# Patient Record
Sex: Female | Born: 1995 | Race: White | Hispanic: No | Marital: Married | Smoking: Never smoker
Health system: Southern US, Community
[De-identification: ages and names within clinical notes are randomized; demographics above are authoritative.]

---

## 2018-07-05 ENCOUNTER — Other Ambulatory Visit: Payer: Self-pay

## 2018-07-05 ENCOUNTER — Emergency Department (HOSPITAL_BASED_OUTPATIENT_CLINIC_OR_DEPARTMENT_OTHER)
Admission: EM | Admit: 2018-07-05 | Discharge: 2018-07-05 | Disposition: A | Discharge status: Home / Self Care | Payer: Commercial Managed Care - PPO | Attending: Emergency Medicine | Admitting: Emergency Medicine

## 2018-07-05 ENCOUNTER — Emergency Department (HOSPITAL_BASED_OUTPATIENT_CLINIC_OR_DEPARTMENT_OTHER): Payer: Commercial Managed Care - PPO

## 2018-07-05 ENCOUNTER — Encounter (HOSPITAL_BASED_OUTPATIENT_CLINIC_OR_DEPARTMENT_OTHER): Payer: Self-pay | Admitting: Emergency Medicine

## 2018-07-05 DIAGNOSIS — N200 Calculus of kidney: Secondary | ICD-10-CM | POA: Insufficient documentation

## 2018-07-05 DIAGNOSIS — R109 Unspecified abdominal pain: Secondary | ICD-10-CM | POA: Diagnosis present

## 2018-07-05 LAB — URINALYSIS, ROUTINE W REFLEX MICROSCOPIC
Bilirubin Urine: NEGATIVE
Glucose, UA: NEGATIVE mg/dL
Ketones, ur: NEGATIVE mg/dL
Nitrite: NEGATIVE
Protein, ur: NEGATIVE mg/dL
Specific Gravity, Urine: 1.025 (ref 1.005–1.030)
pH: 6 (ref 5.0–8.0)

## 2018-07-05 LAB — BASIC METABOLIC PANEL
Anion gap: 10 (ref 5–15)
BUN: 13 mg/dL (ref 6–20)
CO2: 21 mmol/L — ABNORMAL LOW (ref 22–32)
Calcium: 9.1 mg/dL (ref 8.9–10.3)
Chloride: 104 mmol/L (ref 98–111)
Creatinine, Ser: 1.05 mg/dL — ABNORMAL HIGH (ref 0.44–1.00)
GFR calc Af Amer: >60 mL/min (ref >60)
GFR calc non Af Amer: >60 mL/min (ref >60)
Glucose, Bld: 123 mg/dL — ABNORMAL HIGH (ref 70–99)
POTASSIUM: 3.6 mmol/L (ref 3.5–5.1)
Sodium: 135 mmol/L (ref 135–145)

## 2018-07-05 LAB — CBC WITH DIFFERENTIAL/PLATELET
Abs Immature Granulocytes: 0.09 10*3/uL — ABNORMAL HIGH (ref 0.00–0.07)
Basophils Absolute: 0.1 10*3/uL (ref 0.0–0.1)
Basophils Relative: 1 %
Eosinophils Absolute: 0 10*3/uL (ref 0.0–0.5)
Eosinophils Relative: 0 %
HCT: 37.6 % (ref 36.0–46.0)
Hemoglobin: 13.1 g/dL (ref 12.0–15.0)
Immature Granulocytes: 1 %
LYMPHS ABS: 1.5 10*3/uL (ref 0.7–4.0)
LYMPHS PCT: 9 %
MCH: 30.9 pg (ref 26.0–34.0)
MCHC: 34.8 g/dL (ref 30.0–36.0)
MCV: 88.7 fL (ref 80.0–100.0)
Monocytes Absolute: 0.4 10*3/uL (ref 0.1–1.0)
Monocytes Relative: 2 %
Neutro Abs: 15 10*3/uL — ABNORMAL HIGH (ref 1.7–7.7)
Neutrophils Relative %: 87 %
Platelets: 242 10*3/uL (ref 150–400)
RBC: 4.24 MIL/uL (ref 3.87–5.11)
RDW: 13 % (ref 11.5–15.5)
WBC: 17 10*3/uL — ABNORMAL HIGH (ref 4.0–10.5)
nRBC: 0 % (ref 0.0–0.2)

## 2018-07-05 LAB — URINALYSIS, MICROSCOPIC (REFLEX)

## 2018-07-05 LAB — PREGNANCY, URINE: PREG TEST UR: NEGATIVE

## 2018-07-05 MED ORDER — HYDROCODONE-ACETAMINOPHEN 5-325 MG PO TABS: 1.0000 | ORAL_TABLET | Freq: Four times a day (QID) | ORAL | 0 refills | Status: AC | PRN

## 2018-07-05 MED ORDER — KETOROLAC TROMETHAMINE 15 MG/ML IJ SOLN
15.0000 mg | Freq: Once | INTRAMUSCULAR | Status: AC
  Administered 2018-07-05: 15 mg via INTRAVENOUS
  Filled 2018-07-05: qty 1

## 2018-07-05 MED ORDER — ONDANSETRON HCL 4 MG PO TABS: 4.0000 mg | ORAL_TABLET | Freq: Four times a day (QID) | ORAL | 0 refills | Status: AC

## 2018-07-05 MED ORDER — MORPHINE SULFATE (PF) 4 MG/ML IV SOLN
INTRAVENOUS | Status: AC
  Filled 2018-07-05: qty 1

## 2018-07-05 MED ORDER — MORPHINE SULFATE (PF) 4 MG/ML IV SOLN
4.0000 mg | Freq: Once | INTRAVENOUS | Status: AC
  Administered 2018-07-05: 4 mg via INTRAVENOUS

## 2018-07-05 MED ORDER — ONDANSETRON HCL 4 MG/2ML IJ SOLN
4.0000 mg | Freq: Once | INTRAMUSCULAR | Status: AC
  Administered 2018-07-05: 4 mg via INTRAVENOUS
  Filled 2018-07-05: qty 2

## 2018-07-05 MED ORDER — MORPHINE SULFATE (PF) 4 MG/ML IV SOLN
4.0000 mg | Freq: Once | INTRAVENOUS | Status: AC
  Administered 2018-07-05: 4 mg via INTRAVENOUS
  Filled 2018-07-05: qty 1

## 2018-07-05 NOTE — ED Notes (Signed)
ED Provider at bedside. 

## 2018-07-05 NOTE — ED Triage Notes (Signed)
Reports right flank pain which began this morning.  C/o nausea and diarrhea.  Denies dysuria, hematuria.

## 2018-07-05 NOTE — ED Provider Notes (Signed)
[facility] EMERGENCY DEPARTMENT Provider Note   CSN: [id number] Arrival date & time: 07/05/18  1250     History   Chief Complaint Chief Complaint  Patient presents with  . Flank Pain    HPI Claudia Esparza is a 23 y.o. female.  The history is provided by the patient.  Flank Pain  This is a new problem. The current episode started 6 to 12 hours ago. The problem occurs constantly. The problem has not changed since onset.Associated symptoms include abdominal pain. Associated symptoms comments: Nausea, vomiting, some loose stool.  No urinary sx but started after urinating today.  No vaginal bleeding.  No pain like this before.  No fever.. Nothing aggravates the symptoms. Nothing relieves the symptoms. She has tried nothing for the symptoms. The treatment provided no relief.    History reviewed. No pertinent past medical history.  There are no active problems to display for this patient.   History reviewed. No pertinent surgical history.   OB History   No obstetric history on file.      Home Medications    Prior to Admission medications   Not on File    Family History History reviewed. No pertinent family history.  Social History Social History   Tobacco Use  . Smoking status: Never Smoker  . Smokeless tobacco: Never Used  Substance Use Topics  . Alcohol use: Not on file  . Drug use: Not on file     Allergies   Sulfa antibiotics   Review of Systems Review of Systems  Gastrointestinal: Positive for abdominal pain.  Genitourinary: Positive for flank pain.  All other systems reviewed and are negative.    Physical Exam Updated Vital Signs BP 117/77 (BP Location: Left Arm)   Pulse 70   Temp 97.8 F (36.6 C) (Oral)   Resp 18   Ht 5\' 5"  (1.651 m)   Wt 72.6 kg   LMP 04/30/2018 (Approximate)   SpO2 100%   BMI 26.63 kg/m   Physical Exam Vitals signs and nursing note reviewed.  Constitutional:      General: She is in acute distress.       Appearance: She is well-developed.     Comments: Appears uncomfrotable  HENT:     Head: Normocephalic and atraumatic.     Mouth/Throat:     Mouth: Mucous membranes are moist.     Comments: pale Eyes:     Pupils: Pupils are equal, round, and reactive to light.  Cardiovascular:     Rate and Rhythm: Normal rate and regular rhythm.     Heart sounds: Normal heart sounds. No murmur. No friction rub.  Pulmonary:     Effort: Pulmonary effort is normal.     Breath sounds: Normal breath sounds. No wheezing or rales.  Abdominal:     General: Bowel sounds are normal. There is no distension.     Palpations: Abdomen is soft.     Tenderness: There is abdominal tenderness in the right lower quadrant. There is right CVA tenderness. There is no guarding or rebound.  Musculoskeletal: Normal range of motion.        General: No tenderness.     Comments: No edema  Skin:    General: Skin is warm and dry.     Coloration: Skin is pale.     Findings: No rash.  Neurological:     Mental Status: She is alert and oriented to person, place, and time.     Cranial Nerves: No cranial  nerve deficit.  Psychiatric:        Behavior: Behavior normal.      ED Treatments / Results  Labs (all labs ordered are listed, but only abnormal results are displayed) Labs Reviewed  URINALYSIS, ROUTINE W REFLEX MICROSCOPIC - Abnormal; Notable for the following components:      Result Value   APPearance CLOUDY (*)    Hgb urine dipstick SMALL (*)    Leukocytes,Ua TRACE (*)    All other components within normal limits  URINALYSIS, MICROSCOPIC (REFLEX) - Abnormal; Notable for the following components:   Bacteria, UA FEW (*)    All other components within normal limits  CBC WITH DIFFERENTIAL/PLATELET - Abnormal; Notable for the following components:   WBC 17.0 (*)    Neutro Abs 15.0 (*)    Abs Immature Granulocytes 0.09 (*)    All other components within normal limits  BASIC METABOLIC PANEL - Abnormal; Notable for  the following components:   CO2 21 (*)    Glucose, Bld 123 (*)    Creatinine, Ser 1.05 (*)    All other components within normal limits  PREGNANCY, URINE    EKG None  Radiology Ct Renal Stone Study  Result Date: 07/05/2018 CLINICAL DATA:  Right flank pain and hematuria. EXAM: CT ABDOMEN AND PELVIS WITHOUT CONTRAST TECHNIQUE: Multidetector CT imaging of the abdomen and pelvis was performed following the standard protocol without IV contrast. COMPARISON:  None. FINDINGS: Lower chest: No acute abnormality. Hepatobiliary: No focal liver abnormality is seen. No gallstones, gallbladder wall thickening, or biliary dilatation. Pancreas: Unremarkable. No pancreatic ductal dilatation or surrounding inflammatory changes. Spleen: Normal in size without focal abnormality. Adrenals/Urinary Tract: Adrenal glands are normal. Bilateral small renal stones are noted. A representative stone in the lower pole on the right measures 2.6 mm. There is mild hydronephrosis on the right. The right ureter is also mildly dilated compared to the left. There are multiple calcifications in the pelvis. A 3 mm calcification on axial image 72 is favored to represent a distal ureteral stone. Other calcifications in both side of the pelvis are consistent with phleboliths. Stomach/Bowel: Stomach and small bowel are normal. Colon is normal. The appendix is not clearly seen but there is no secondary evidence of appendicitis. Vascular/Lymphatic: No significant vascular findings are present. No enlarged abdominal or pelvic lymph nodes. Reproductive: Uterus and bilateral adnexa are unremarkable. Other: No abdominal wall hernia or abnormality. No abdominopelvic ascites. Musculoskeletal: No acute or significant osseous findings. IMPRESSION: 1. Mild right hydronephrosis and ureterectasis. A calcification adjacent to the base of the bladder measuring 3 mm is favored to represent a distal right ureteral stone, causing the patient's symptoms. There  are multiple stones in both kidneys. 2. The appendix is not visualized but there is no secondary evidence of appendicitis. Electronically Signed   By: Gerome Sam III M.D   On: 07/05/2018 14:57    Procedures Procedures (including critical care time)  Medications Ordered in ED Medications  morphine 4 MG/ML injection 4 mg (has no administration in time range)  ondansetron (ZOFRAN) injection 4 mg (has no administration in time range)     Initial Impression / Assessment and Plan / ED Course  I have reviewed the triage vital signs and the nursing notes.  Pertinent labs & imaging results that were available during my care of the patient were reviewed by me and considered in my medical decision making (see chart for details).     Pt with symptoms consistent with kidney stone.  Denies infectious sx, or GI symptoms.  Low concern for diverticulitis and no risk factors or history suggestive of AAA.  No hx suggestive of GU source (discharge) and otherwise pt is healthy.  Will treat pain and ensure no infection with UA, CBC, BMP and will get stone study to further eval.  3:22 PM Cytosis of 17,000 and creatinine 1.05.  UA with hemoglobin but no evidence of infection.  CT consistent with a 3 mm distal stone but also noted multiple stones in bilateral kidneys.  On reevaluation after 8 mg of morphine patient states the pain is more tolerable but still at a 5 out of 10.  She was given a dose of Toradol and will reassess.  4:07 PM Pt feeling better and will d/c home with pain meds  Final Clinical Impressions(s) / ED Diagnoses   Final diagnoses:  Kidney stone    ED Discharge Orders         Ordered    HYDROcodone-acetaminophen (NORCO/VICODIN) 5-325 MG tablet  Every 6 hours PRN     07/05/18 1606    ondansetron (ZOFRAN) 4 MG tablet  Every 6 hours     07/05/18 1606           Gwyneth Sprout, MD 07/05/18 1607

## 2018-07-05 NOTE — Discharge Instructions (Signed)
Make sure you are drinking plenty of fluids.  Take a pain pill when you pick it up and then another one before you go to bed to avoid breakthrough pain.  You can also take ibuprofen.  Please return if you develop excruciating pain, persistent vomiting or a fever

## 2020-09-16 IMAGING — CT CT RENAL STONE PROTOCOL
2 of 4 series · 16 of 46 positions shown, 18 images · non-contrast
Comparison: None.

CLINICAL DATA: Right flank pain and hematuria.

EXAM:
CT ABDOMEN AND PELVIS WITHOUT CONTRAST
TECHNIQUE: Multidetector CT imaging of the abdomen and pelvis was performed
following the standard protocol without IV contrast.

[Series 2: axial st · axial · 0.79mm/px · z∈[+225,+645]mm · 13 of 92 slices shown, 15 images]
[im 4/92  soft-tissue]
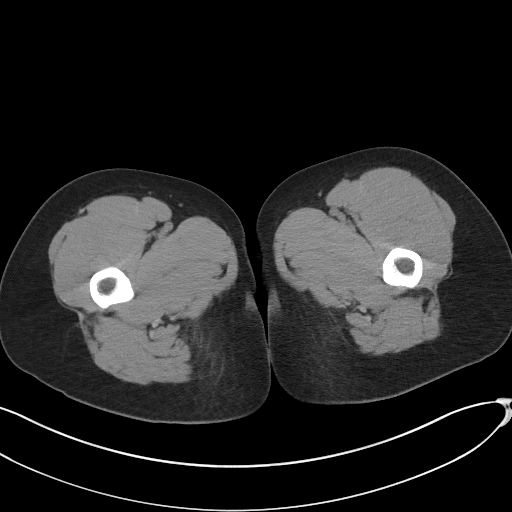
[im 4/92  bone]
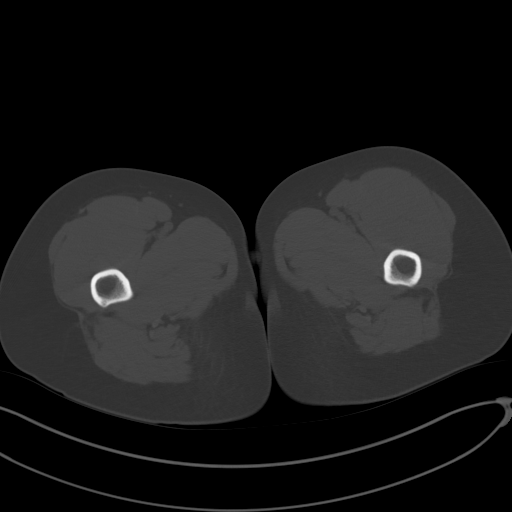
[im 11/92  soft-tissue]
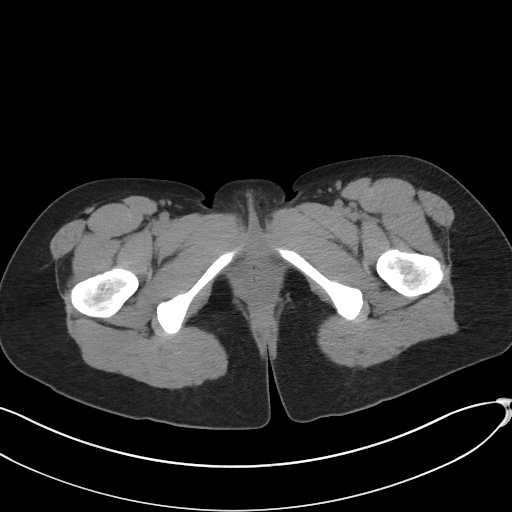
[im 18/92  soft-tissue]
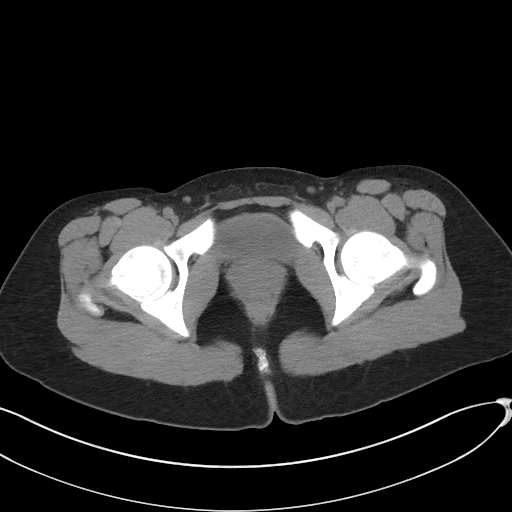
[im 25/92  soft-tissue]
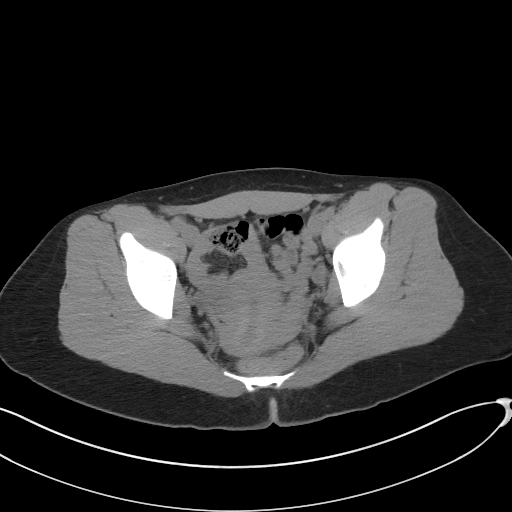
[im 32/92  soft-tissue]
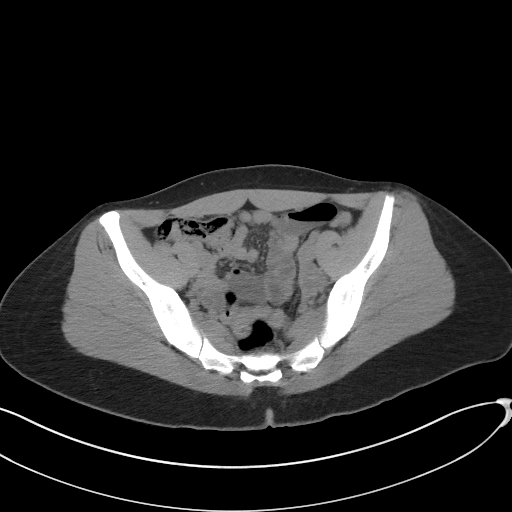
[im 39/92  soft-tissue]
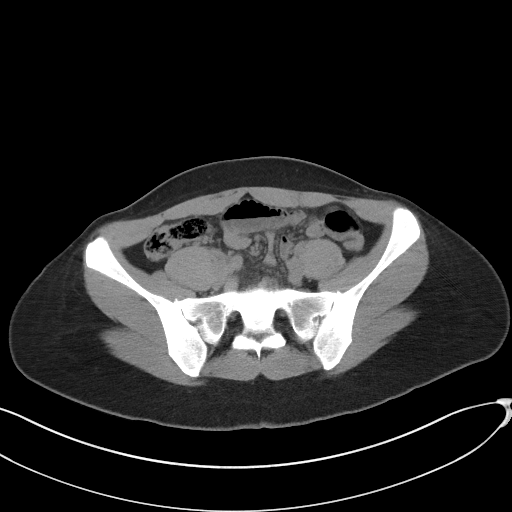
[im 46/92  soft-tissue]
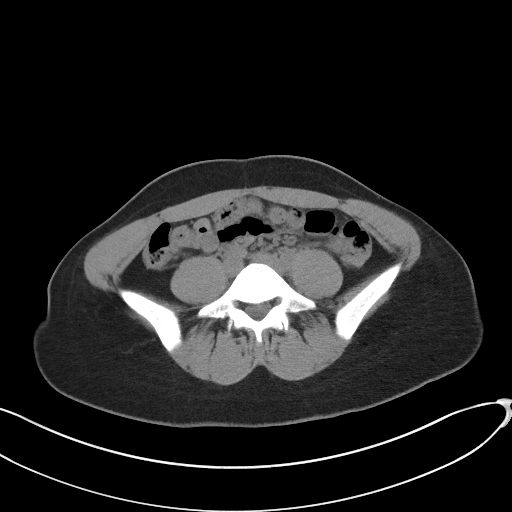
[im 53/92  soft-tissue]
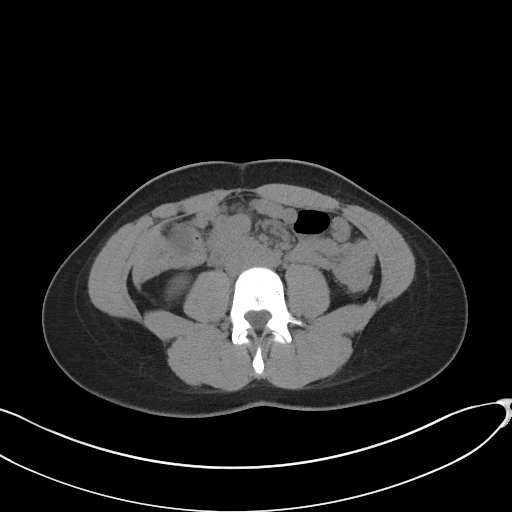
[im 60/92  soft-tissue]
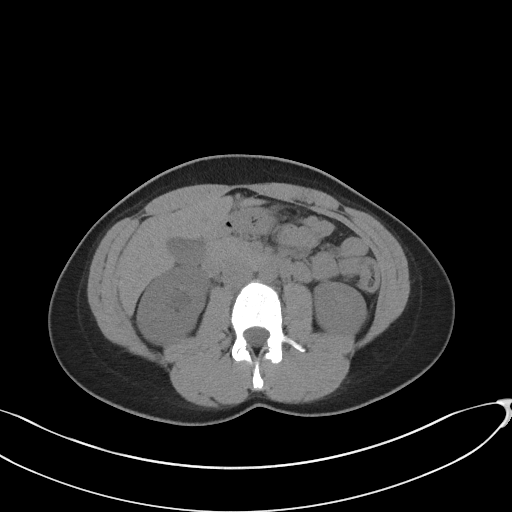
[im 60/92  bone]
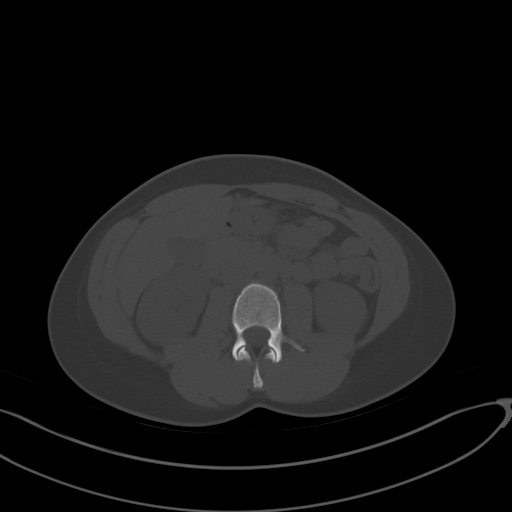
[im 67/92  soft-tissue]
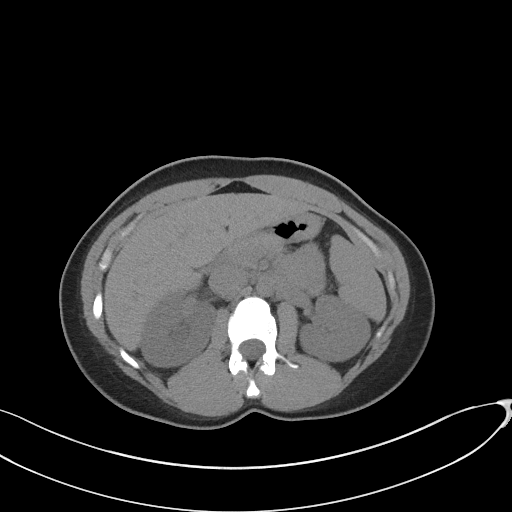
[im 74/92  soft-tissue]
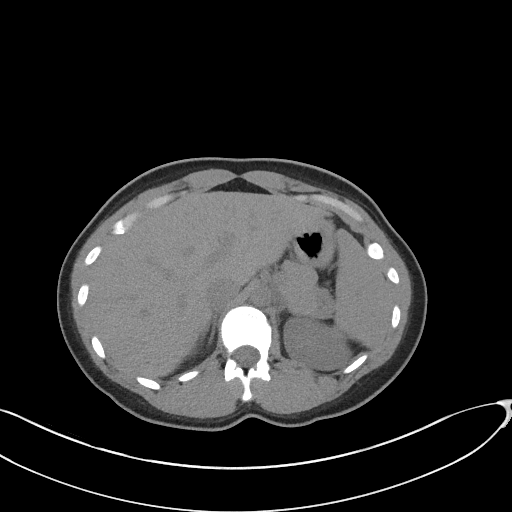
[im 81/92  soft-tissue]
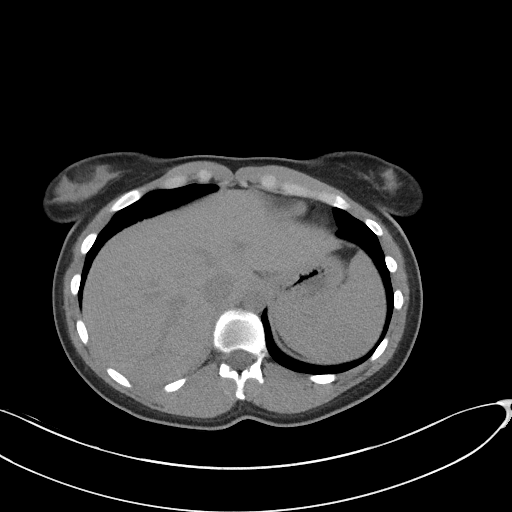
[im 88/92  soft-tissue]
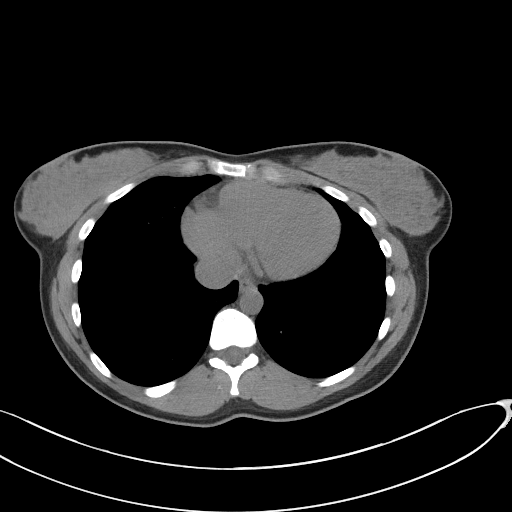

[Series 5: coronal st · coronal · 0.82mm/px · 3 of 71 slices shown]
[im 24/71  soft-tissue]
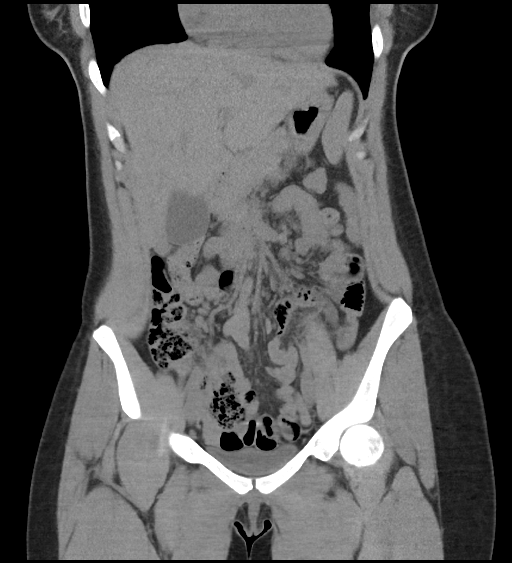
[im 32/71  soft-tissue]
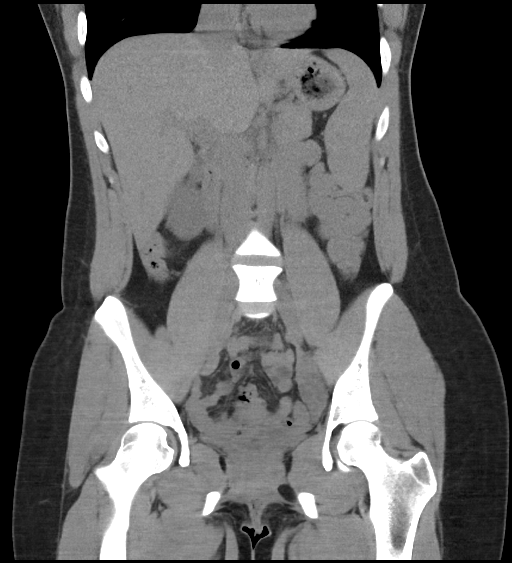
[im 39/71  soft-tissue]
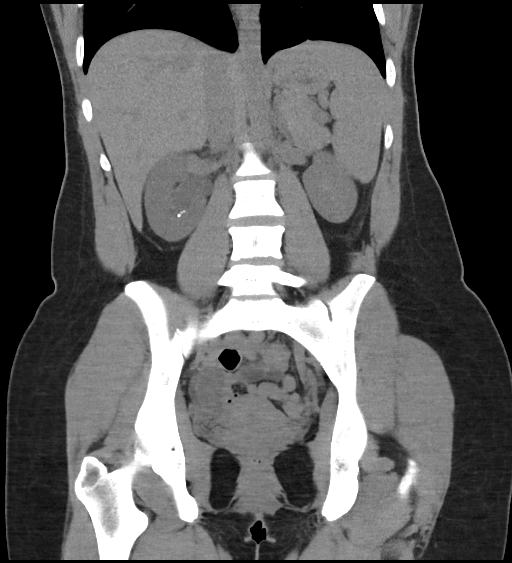

[16 of 46 positions shown; findings below may reference images not displayed]

FINDINGS: Lower chest: No acute abnormality.

Hepatobiliary: No focal liver abnormality is seen. No gallstones,
gallbladder wall thickening, or biliary dilatation.

Pancreas: Unremarkable. No pancreatic ductal dilatation or
surrounding inflammatory changes.

Spleen: Normal in size without focal abnormality.

Adrenals/Urinary Tract: Adrenal glands are normal. Bilateral small
renal stones are noted. A representative stone in the lower pole on
the right measures 2.6 mm. There is mild hydronephrosis on the
right. The right ureter is also mildly dilated compared to the left.
There are multiple calcifications in the pelvis. A 3 mm
calcification on axial image 72 is favored to represent a distal
ureteral stone. Other calcifications in both side of the pelvis are
consistent with phleboliths.

Stomach/Bowel: Stomach and small bowel are normal. Colon is normal.
The appendix is not clearly seen but there is no secondary evidence
of appendicitis.

Vascular/Lymphatic: No significant vascular findings are present. No
enlarged abdominal or pelvic lymph nodes.

Reproductive: Uterus and bilateral adnexa are unremarkable.

Other: No abdominal wall hernia or abnormality. No abdominopelvic
ascites.

Musculoskeletal: No acute or significant osseous findings.
IMPRESSION: 1. Mild right hydronephrosis and ureterectasis. A calcification
adjacent to the base of the bladder measuring 3 mm is favored to
represent a distal right ureteral stone, causing the patient's
symptoms. There are multiple stones in both kidneys.
2. The appendix is not visualized but there is no secondary evidence
of appendicitis.
